# Patient Record
Sex: Male | Born: 1940 | Race: White | Hispanic: No | Smoking: Current every day smoker
Health system: Southern US, Community
[De-identification: ages and names within clinical notes are randomized; demographics above are authoritative.]

## PROBLEM LIST (undated history)

## (undated) DIAGNOSIS — I10 Essential (primary) hypertension: Secondary | ICD-10-CM

## (undated) DIAGNOSIS — E119 Type 2 diabetes mellitus without complications: Secondary | ICD-10-CM

## (undated) DIAGNOSIS — E785 Hyperlipidemia, unspecified: Secondary | ICD-10-CM

## (undated) DIAGNOSIS — I219 Acute myocardial infarction, unspecified: Secondary | ICD-10-CM

## (undated) DIAGNOSIS — C801 Malignant (primary) neoplasm, unspecified: Secondary | ICD-10-CM

## (undated) HISTORY — DX: Type 2 diabetes mellitus without complications: E11.9

## (undated) HISTORY — DX: Essential (primary) hypertension: I10

## (undated) HISTORY — DX: Acute myocardial infarction, unspecified: I21.9

## (undated) HISTORY — DX: Malignant (primary) neoplasm, unspecified: C80.1

## (undated) HISTORY — DX: Hyperlipidemia, unspecified: E78.5

---

## 2013-06-01 ENCOUNTER — Encounter: Payer: Self-pay | Admitting: Podiatry

## 2013-06-01 ENCOUNTER — Ambulatory Visit (INDEPENDENT_AMBULATORY_CARE_PROVIDER_SITE_OTHER): Payer: Medicare Other | Admitting: Podiatry

## 2013-06-01 VITALS — BP 144/81 | HR 96 | Resp 40 | Ht 66.0 in | Wt 180.0 lb

## 2013-06-01 DIAGNOSIS — M79609 Pain in unspecified limb: Secondary | ICD-10-CM

## 2013-06-01 DIAGNOSIS — B351 Tinea unguium: Secondary | ICD-10-CM

## 2013-06-01 NOTE — Progress Notes (Signed)
  Subjective:    Patient ID: Jose Knight, male    DOB: [birth year], 72 y.o.   MRN: [id number] "I don't think he's had his toenails trimmed in about 2-3 years," stated patient's daughter.  HPI Comments: N  Long, Diabetes, thick, painful L  Debridement B/L  D  2-3 years O  Gradually  C  Gotten Worse A  Shoes T  None      Review of Systems  HENT: Positive for hearing loss.   Respiratory: Positive for shortness of breath.   Musculoskeletal: Positive for gait problem.  All other systems reviewed and are negative.   Patient's medical history is not totally known however he does seem to include diabetes, hypertension. Patient does not know what medications he is totally taking at this time. He has recently moved from [place] to [place] to live with his daughter.     Objective:   Physical Exam Pleasant 72 year old white male presents with his daughter.  Vascular: The DP and PT pulses are two over four bilaterally. Capillary fill is immediate bilaterally.  Neurological: Sensation detained in monofilament wire intact Jennifer 10 locations bilaterally. Vibratory sensation intact bilaterally.  Dermatological: Extreme neglect of hygiene with interdigital buildup of hyperkeratotic tissue and lack of hygiene. All the toenails are exceptionally , elongated , extending beyond the distal toes as much as 2 inches and curling into the adjacent toes or into the toe itself. The lack of hygiene extensor was lower legs.  Musculoskeletal: No deformities noted bilaterally. No restriction ankle subtalar midtarsal joints bilaterally.      Assessment & Plan:   Assessment: Extremely neglected hygiene lower extremities and feet bilaterally. Significant painful onychomycoses x10. Satisfactory neurovascular status bilaterally  Plan: All 10 toenails and debrided without any bleeding. Patient and daughter advised to begin daily soaking in warm soapy water, and rubbing the  excessive skin buildup and debris with a washcloth on a daily basis until the skin looks normal.  Reappoint at three-month intervals.

## 2013-06-01 NOTE — Patient Instructions (Signed)
Wash and soak  feet daily and in more soapy water. Take a washcloth and gently massage excessive skin buildup daily until skin looks normal. Rub into his lower legs and feet and all-purpose skin lotion. Reappoint at three-month intervals.

## 2013-08-26 ENCOUNTER — Emergency Department (HOSPITAL_COMMUNITY)
Admission: EM | Admit: 2013-08-26 | Discharge: 2013-08-26 | Disposition: A | Discharge status: Home / Self Care | Payer: Medicare Other | Attending: Emergency Medicine | Admitting: Emergency Medicine

## 2013-08-26 ENCOUNTER — Emergency Department (HOSPITAL_COMMUNITY): Payer: Medicare Other

## 2013-08-26 ENCOUNTER — Encounter (HOSPITAL_COMMUNITY): Payer: Self-pay | Admitting: Emergency Medicine

## 2013-08-26 DIAGNOSIS — F17200 Nicotine dependence, unspecified, uncomplicated: Secondary | ICD-10-CM | POA: Insufficient documentation

## 2013-08-26 DIAGNOSIS — R221 Localized swelling, mass and lump, neck: Secondary | ICD-10-CM

## 2013-08-26 DIAGNOSIS — R220 Localized swelling, mass and lump, head: Secondary | ICD-10-CM | POA: Insufficient documentation

## 2013-08-26 DIAGNOSIS — Z79899 Other long term (current) drug therapy: Secondary | ICD-10-CM | POA: Insufficient documentation

## 2013-08-26 DIAGNOSIS — Z792 Long term (current) use of antibiotics: Secondary | ICD-10-CM | POA: Insufficient documentation

## 2013-08-26 DIAGNOSIS — Z859 Personal history of malignant neoplasm, unspecified: Secondary | ICD-10-CM | POA: Insufficient documentation

## 2013-08-26 DIAGNOSIS — E119 Type 2 diabetes mellitus without complications: Secondary | ICD-10-CM | POA: Insufficient documentation

## 2013-08-26 DIAGNOSIS — IMO0002 Reserved for concepts with insufficient information to code with codable children: Secondary | ICD-10-CM | POA: Insufficient documentation

## 2013-08-26 DIAGNOSIS — E785 Hyperlipidemia, unspecified: Secondary | ICD-10-CM | POA: Insufficient documentation

## 2013-08-26 DIAGNOSIS — I10 Essential (primary) hypertension: Secondary | ICD-10-CM | POA: Insufficient documentation

## 2013-08-26 DIAGNOSIS — I252 Old myocardial infarction: Secondary | ICD-10-CM | POA: Insufficient documentation

## 2013-08-26 LAB — CBC WITH DIFFERENTIAL/PLATELET
BASOS ABS: 0.1 10*3/uL (ref 0.0–0.1)
Basophils Relative: 1 % (ref 0–1)
Eosinophils Absolute: 0.2 10*3/uL (ref 0.0–0.7)
Eosinophils Relative: 2 % (ref 0–5)
HEMATOCRIT: 46.5 % (ref 39.0–52.0)
Hemoglobin: 15.6 g/dL (ref 13.0–17.0)
LYMPHS PCT: 25 % (ref 12–46)
Lymphs Abs: 2.5 10*3/uL (ref 0.7–4.0)
MCH: 33.7 pg (ref 26.0–34.0)
MCHC: 33.5 g/dL (ref 30.0–36.0)
MCV: 100.4 fL — ABNORMAL HIGH (ref 78.0–100.0)
Monocytes Absolute: 0.8 10*3/uL (ref 0.1–1.0)
Monocytes Relative: 8 % (ref 3–12)
NEUTROS ABS: 6.6 10*3/uL (ref 1.7–7.7)
Neutrophils Relative %: 64 % (ref 43–77)
PLATELETS: 138 10*3/uL — AB (ref 150–400)
RBC: 4.63 MIL/uL (ref 4.22–5.81)
RDW: 12.5 % (ref 11.5–15.5)
WBC: 10.3 10*3/uL (ref 4.0–10.5)

## 2013-08-26 LAB — BASIC METABOLIC PANEL
BUN: 25 mg/dL — AB (ref 6–23)
CHLORIDE: 101 meq/L (ref 96–112)
CO2: 25 mEq/L (ref 19–32)
Calcium: 9.3 mg/dL (ref 8.4–10.5)
Creatinine, Ser: 1.54 mg/dL — ABNORMAL HIGH (ref 0.50–1.35)
GFR calc Af Amer: 51 mL/min — ABNORMAL LOW (ref >90)
GFR, EST NON AFRICAN AMERICAN: 44 mL/min — AB (ref >90)
GLUCOSE: 111 mg/dL — AB (ref 70–99)
Potassium: 5.6 mEq/L — ABNORMAL HIGH (ref 3.7–5.3)
Sodium: 138 mEq/L (ref 137–147)

## 2013-08-26 LAB — PRO B NATRIURETIC PEPTIDE: Pro B Natriuretic peptide (BNP): 80.4 pg/mL (ref 0–125)

## 2013-08-26 MED ORDER — METHYLPREDNISOLONE SODIUM SUCC 125 MG IJ SOLR
125.0000 mg | Freq: Once | INTRAMUSCULAR | Status: AC
  Administered 2013-08-26: 125 mg via INTRAVENOUS
  Filled 2013-08-26: qty 2

## 2013-08-26 MED ORDER — PREDNISONE 10 MG PO TABS: 40.0000 mg | ORAL_TABLET | Freq: Every day | ORAL | Status: DC

## 2013-08-26 MED ORDER — FAMOTIDINE 20 MG PO TABS: 20.0000 mg | ORAL_TABLET | Freq: Two times a day (BID) | ORAL | Status: DC

## 2013-08-26 MED ORDER — DEXTROSE 5 % IV SOLN
1.0000 g | Freq: Once | INTRAVENOUS | Status: AC
  Administered 2013-08-26: 1 g via INTRAVENOUS
  Filled 2013-08-26: qty 10

## 2013-08-26 MED ORDER — FAMOTIDINE IN NACL 20-0.9 MG/50ML-% IV SOLN
20.0000 mg | Freq: Once | INTRAVENOUS | Status: AC
  Administered 2013-08-26: 20 mg via INTRAVENOUS
  Filled 2013-08-26: qty 50

## 2013-08-26 MED ORDER — SODIUM CHLORIDE 0.9 % IV BOLUS (SEPSIS)
500.0000 mL | Freq: Once | INTRAVENOUS | Status: AC
  Administered 2013-08-26: 500 mL via INTRAVENOUS

## 2013-08-26 MED ORDER — SODIUM CHLORIDE 0.9 % IV SOLN
INTRAVENOUS | Status: DC
  Administered 2013-08-26: 19:00:00 via INTRAVENOUS

## 2013-08-26 MED ORDER — DIPHENHYDRAMINE HCL 25 MG PO TABS: 25.0000 mg | ORAL_TABLET | Freq: Four times a day (QID) | ORAL | Status: DC

## 2013-08-26 MED ORDER — CLINDAMYCIN HCL 150 MG PO CAPS: 150.0000 mg | ORAL_CAPSULE | Freq: Four times a day (QID) | ORAL | Status: DC

## 2013-08-26 MED ORDER — DIPHENHYDRAMINE HCL 50 MG/ML IJ SOLN
25.0000 mg | Freq: Once | INTRAMUSCULAR | Status: AC
  Administered 2013-08-26: 21:00:00 via INTRAVENOUS
  Filled 2013-08-26: qty 1

## 2013-08-26 MED ORDER — IOHEXOL 300 MG/ML  SOLN
100.0000 mL | Freq: Once | INTRAMUSCULAR | Status: AC | PRN
  Administered 2013-08-26: 100 mL via INTRAVENOUS

## 2013-08-26 NOTE — ED Notes (Signed)
Patient transported to CT 

## 2013-08-26 NOTE — ED Provider Notes (Signed)
CSN: [id number]     Arrival date & time 08/26/13  1514 History   First MD Initiated Contact with Patient 08/26/13 1702     Chief Complaint  Patient presents with  . Foreign Body     (Consider location/radiation/quality/duration/timing/severity/associated sxs/prior Treatment) The history is provided by the patient and a relative.   73 year old male brought in by his daughter. Patient felt as if a meatloaf got stuck while he was eating at several hours ago. He has a sensation is still stuck in his throat. Patient in triage reported shortness of breath but that once back in the room but denied any shortness of breath. Sats were 95%. Patient however has had the something to drink and a banana teeth since then it went down fine. Patient feels that the foreign body is just above his Adam's apple. Patient handling her saliva fine. No history of this ever happening before. Patient does have an extensive smoking history. Patient does have a history of diabetes.  Past Medical History  Diagnosis Date  . Cancer   . Myocardial infarction   . Diabetes mellitus without complication   . Hypertension   . Hyperlipidemia    History reviewed. No pertinent past surgical history. Family History  Problem Relation Age of Onset  . Kidney disease Mother   . COPD Father    History  Substance Use Topics  . Smoking status: Current Every Day Smoker -- 1.00 packs/day    Types: Cigarettes  . Smokeless tobacco: Not on file  . Alcohol Use: No    Review of Systems  Constitutional: Negative for fever.  HENT: Positive for trouble swallowing. Negative for congestion.   Eyes: Negative for redness.  Respiratory: Negative for shortness of breath.   Gastrointestinal: Negative for abdominal pain.  Musculoskeletal: Negative for myalgias.  Skin: Negative for rash.  Neurological: Negative for speech difficulty and headaches.  Hematological: Does not bruise/bleed easily.  Psychiatric/Behavioral: Negative for  confusion.      Allergies  Review of patient's allergies indicates no known allergies.  Home Medications   Current Outpatient Rx  Name  Route  Sig  Dispense  Refill  . lisinopril (PRINIVIL,ZESTRIL) 20 MG tablet   Oral   Take 20 mg by mouth daily.         . metFORMIN (GLUMETZA) 500 MG (MOD) 24 hr tablet   Oral   Take 500 mg by mouth daily with breakfast.         . clindamycin (CLEOCIN) 150 MG capsule   Oral   Take 1 capsule (150 mg total) by mouth every 6 (six) hours.   28 capsule   0   . diphenhydrAMINE (BENADRYL) 25 MG tablet   Oral   Take 1 tablet (25 mg total) by mouth every 6 (six) hours.   20 tablet   0   . famotidine (PEPCID) 20 MG tablet   Oral   Take 1 tablet (20 mg total) by mouth 2 (two) times daily.   30 tablet   0   . predniSONE (DELTASONE) 10 MG tablet   Oral   Take 4 tablets (40 mg total) by mouth daily.   20 tablet   0    BP 173/77  Pulse 107  Temp(Src) 98 F (36.7 C) (Oral)  Resp 18  SpO2 97% Physical Exam  Nursing note and vitals reviewed. Constitutional: He is oriented to person, place, and time. He appears well-developed and well-nourished. No distress.  HENT:  Head: Normocephalic and atraumatic.  Mouth/Throat:  Oropharynx is clear and moist. No oropharyngeal exudate.  Oropharynx without evidence of foreign body uvula is midline no exudate no swelling of tongue or lips no pharyngeal swelling noted.  Eyes: Conjunctivae and EOM are normal. Pupils are equal, round, and reactive to light.  Neck: Normal range of motion. Neck supple. No tracheal deviation present. No thyromegaly present.  Subtle swelling of the area below the chin noted no erythema. No cervical adenopathy.  Cardiovascular: Normal rate, regular rhythm and normal heart sounds.   No murmur heard. Pulmonary/Chest: Effort normal and breath sounds normal. No stridor. No respiratory distress.  Abdominal: Soft. Bowel sounds are normal. There is no tenderness.  Musculoskeletal:  Normal range of motion.  Lymphadenopathy:    He has no cervical adenopathy.  Neurological: He is alert and oriented to person, place, and time. No cranial nerve deficit. He exhibits normal muscle tone. Coordination normal.    ED Course  Procedures (including critical care time) Labs Review Labs Reviewed  CBC WITH DIFFERENTIAL - Abnormal; Notable for the following:    MCV 100.4 (*)    Platelets 138 (*)    All other components within normal limits  BASIC METABOLIC PANEL - Abnormal; Notable for the following:    Potassium 5.6 (*)    Glucose, Bld 111 (*)    BUN 25 (*)    Creatinine, Ser 1.54 (*)    GFR calc non Af Amer 44 (*)    GFR calc Af Amer 51 (*)    All other components within normal limits  PRO B NATRIURETIC PEPTIDE   Results for orders placed during the hospital encounter of 08/26/13  CBC WITH DIFFERENTIAL      Result Value Ref Range   WBC 10.3  4.0 - 10.5 K/uL   RBC 4.63  4.22 - 5.81 MIL/uL   Hemoglobin 15.6  13.0 - 17.0 g/dL   HCT 46.5  39.0 - 52.0 %   MCV 100.4 (*) 78.0 - 100.0 fL   MCH 33.7  26.0 - 34.0 pg   MCHC 33.5  30.0 - 36.0 g/dL   RDW 12.5  11.5 - 15.5 %   Platelets 138 (*) 150 - 400 K/uL   Neutrophils Relative % 64  43 - 77 %   Neutro Abs 6.6  1.7 - 7.7 K/uL   Lymphocytes Relative 25  12 - 46 %   Lymphs Abs 2.5  0.7 - 4.0 K/uL   Monocytes Relative 8  3 - 12 %   Monocytes Absolute 0.8  0.1 - 1.0 K/uL   Eosinophils Relative 2  0 - 5 %   Eosinophils Absolute 0.2  0.0 - 0.7 K/uL   Basophils Relative 1  0 - 1 %   Basophils Absolute 0.1  0.0 - 0.1 K/uL  BASIC METABOLIC PANEL      Result Value Ref Range   Sodium 138  137 - 147 mEq/L   Potassium 5.6 (*) 3.7 - 5.3 mEq/L   Chloride 101  96 - 112 mEq/L   CO2 25  19 - 32 mEq/L   Glucose, Bld 111 (*) 70 - 99 mg/dL   BUN 25 (*) 6 - 23 mg/dL   Creatinine, Ser 1.54 (*) 0.50 - 1.35 mg/dL   Calcium 9.3  8.4 - 10.5 mg/dL   GFR calc non Af Amer 44 (*) >90 mL/min   GFR calc Af Amer 51 (*) >90 mL/min  PRO B  NATRIURETIC PEPTIDE      Result Value Ref Range  Pro B Natriuretic peptide (BNP) 80.4  0 - 125 pg/mL    Imaging Review Dg Chest 2 View  08/26/2013   CLINICAL DATA:  Shortness of breath and cough. Possible food impaction.  EXAM: CHEST  2 VIEW  COMPARISON:  None.  FINDINGS: The heart size and mediastinal contours are within normal limits. Both lungs are clear. The visualized skeletal structures are unremarkable.  IMPRESSION: No active cardiopulmonary disease.   Electronically Signed   By: Kalman Jewels M.D.   On: 08/26/2013 18:10   Ct Soft Tissue Neck Wo Contrast  08/26/2013   CLINICAL DATA:  Cough, sore throat, clinical concern of foreign body.  EXAM: CT NECK WITHOUT CONTRAST  TECHNIQUE: Multidetector CT imaging of the neck was performed following the standard protocol without intravenous contrast.  COMPARISON:  None.  FINDINGS: The skull base is unremarkable.  Evaluation of the neck demonstrates ill-defined increased soft tissue fullness in the left lingual tonsil region. This has a vague nodular appearance. Image 63 series 5. No definitive well-defined mass is appreciated though evaluation is limited secondary due to the lack of IV contrast. The airway is patent. The left parapharyngeal space is poorly visualized. There is asymmetric narrowing of the prevertebral space on the left.  The laryngeal region is unremarkable.  Subcentimeter lymph nodes identified within the carotid space as well as within the anterior-posterior cervical chain regions. No free fluid or loculated fluid collections are appreciated. The vascular structures demonstrate atherosclerotic calcifications within the carotid vessels. There is no evidence of radiopaque foreign bodies.  The salivary glands are symmetric and appear unremarkable. The parotid glands are symmetric and appear unremarkable. Small nodules are identified within the parotid glands likely representing lymph nodes.  Evaluation of the lung apices demonstrates  prominence of the interstitial markings. A vague ground-glass nodular density projects within the medial apex of the left upper lobe image 22 series 1055. This area measures 8.9 mm in diameter. Mild centrilobular emphysematous changes are appreciated within lung apices. No focal regions of consolidation are identified.  There is no evidence of aggressive appearing osseous lesions. Degenerative disc disease changes appreciated within the lower cervical spine at the C6-7 level.  IMPRESSION: 1. Findings concerning for soft tissue prominence along the base of the tongue and the lingual tonsil region on the left. These findings are concerning for a mass. Infection cannot be excluded if clinically appropriate. Further evaluation with contrasted neck CT is recommended. The airway is patent. Small subcentimeter lymph nodes are appreciated within the anterior posterior cervical chains. 2. Indeterminate ground-glass nodule within the left upper lobe. Further evaluation with nonemergent chest CT is recommended. 3. 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   Electronically Signed   By: Margaree Mackintosh M.D.   On: 08/26/2013 18:23   Ct Soft Tissue Neck W Contrast  08/26/2013   CLINICAL DATA:  Foreign body sensation in throat after eating several areas ago. Shortness of breath.  EXAM: CT NECK WITH CONTRAST  TECHNIQUE: Multidetector CT imaging of the neck was performed using the standard protocol following the bolus administration of intravenous contrast.  CONTRAST:  142mL OMNIPAQUE IOHEXOL 300 MG/ML  SOLN  COMPARISON:  CT NECK W/O CM dated 08/26/2013  FINDINGS: The soft tissues of the oropharynx now appear more symmetric. No focal mucosal lesion is identified. However, the epiglottis is swollen and there is a small amount of fluid within the retropharyngeal space. No drainable fluid collections are identified. There is no evidence of foreign body within the airway.  The vocal cords do not appear swollen. The glottis is  closed.  The visualized intracranial contents are unremarkable. The paranasal sinuses are clear. There are no enlarged cervical lymph nodes. The salivary and thyroid glands appear unremarkable. There is stable spondylosis with disc space loss and uncinate spurring at C6-7. No epidural inflammatory changes are identified.  There is some subcutaneous edema in suprasternal region without focal fluid collection. Mild synovial thickening at both sternoclavicular joints appears symmetric.  There are diffuse vascular calcifications with luminal stenosis in both carotid bulbs.  Emphysematous changes are again noted both lung apices. There is a stable ground-glass density in the left upper lobe measuring 9 mm on image 110.  IMPRESSION: 1. Follow examination confirms the presence of inflammatory changes in the oropharynx and supraglottic space with thickening of the epiglottis and a small amount of retropharyngeal fluid. Consider allergic reaction. 2. No focal mucosal lesion or foreign body identified. ENT follow-up recommended. 3. Mild suprasternal subcutaneous edema may reflect cellulitis. 4. Bilateral carotid disease with luminal stenosis in the bulbous. 5. These results were called by telephone at the time of interpretation on 08/26/2013 at 8:27 PM to Dr. Fredia Sorrow , who verbally acknowledged these results.   Electronically Signed   By: Camie Patience M.D.   On: 08/26/2013 20:27    EKG Interpretation   None       MDM   Final diagnoses:  Throat swelling    Patient presented with foreign body sensation and thought that he had meatloaf stuck in his throat. However patient was handling saliva well and actually had done something to the injury since the event with the meatloaf. Workup pursued first the chest x-ray negative and CT soft tissue neck without contrast raise concerns for a mass affect. There was repeated with contrast which raised concerns perhaps 4 inflammatory changes oropharynx and supraglottic  space suggestive of maybe an allergic reaction. Mass is still not completely ruled out in a patient with significant smoking history. There never was any clinical indication or CT indication of foreign body.  The patient was nontoxic no acute distress able to even drink in the emergency department without any problems. Room air sats were 95%. Patient's treatment plan focused on treatment for possible infection with clindamycin and then treatment for allergic reaction with Benadryl Pepcid and prednisone. Patient will also continue that at home. Referral and followup information for your nose and throat for a good thorough examination of the oral pharynx area provided. Patient understands the importance of following up with ear nose and throat Dr.  Patient discharged home.    Mervin Kung, MD 09/01/13 [phone]

## 2013-08-26 NOTE — ED Notes (Signed)
Patient transported from CT 

## 2013-08-26 NOTE — ED Notes (Signed)
Per pt and family pt was eating meatloaf a few hours ago and got and now it is stuck in his throat. sts SOB. sats 95% at triage

## 2013-08-26 NOTE — Discharge Instructions (Signed)
Follow up with ear nose and throat: Make an appointment. In the meantime we are not exactly sure why he had the swelling in your throat could be allergic reaction. So take the Benadryl every 6 hours for the next 2 days take Pepcid for the next 7 days. Take prednisone for the next 5 days. Take the clindamycin the antibiotic until finished. Return for any newer worse symptoms. Return for any difficulty swallowing or breathing. Return for any swelling of the tongue. The ear nose and throat specialist will be able to look up your CAT scans that were done today.

## 2013-09-07 ENCOUNTER — Encounter: Payer: Self-pay | Admitting: Podiatry

## 2013-09-07 ENCOUNTER — Ambulatory Visit (INDEPENDENT_AMBULATORY_CARE_PROVIDER_SITE_OTHER): Payer: Medicare Other | Admitting: Podiatry

## 2013-09-07 VITALS — BP 137/72 | HR 87 | Resp 18

## 2013-09-07 DIAGNOSIS — B351 Tinea unguium: Secondary | ICD-10-CM

## 2013-09-07 DIAGNOSIS — M79609 Pain in unspecified limb: Secondary | ICD-10-CM

## 2013-09-08 NOTE — Progress Notes (Signed)
Patient ID: Jose Knight, male   DOB: 1941/06/21, 73 y.o.   MRN: [id number]  Subjective: This patient presents for debridement of painful mycotic toenails. On the initial visit of 06/01/2013 and extremely neglected hygiene with excessive dermal buildup was noted bilaterally.  Objective: Residual dermal buildup from lack of hygiene still persists on toes 1 through 5 bilaterally and residual buildup noted on the plantar aspect. All toenails are elongated, brittle, discolored, hypertrophied.  Assessment: Symptomatic onychomycoses x10 Improving personal hygiene  Plan: Nails x10 debrided back without a bleeding. Instructed patient and daughter to continue gently massaging the dermal buildup off with a washcloth on a daily basis.  Reappoint x3 months

## 2013-12-14 ENCOUNTER — Ambulatory Visit: Payer: Medicare Other | Admitting: Podiatry

## 2014-02-08 ENCOUNTER — Encounter: Payer: Self-pay | Admitting: Podiatry

## 2014-02-08 ENCOUNTER — Ambulatory Visit (INDEPENDENT_AMBULATORY_CARE_PROVIDER_SITE_OTHER): Payer: Medicare Other | Admitting: Podiatry

## 2014-02-08 VITALS — BP 154/81 | HR 91 | Resp 18

## 2014-02-08 DIAGNOSIS — M79609 Pain in unspecified limb: Secondary | ICD-10-CM

## 2014-02-08 DIAGNOSIS — M79673 Pain in unspecified foot: Secondary | ICD-10-CM

## 2014-02-08 DIAGNOSIS — B351 Tinea unguium: Secondary | ICD-10-CM

## 2014-02-09 NOTE — Progress Notes (Signed)
Patient ID: Jose Knight, male   DOB: 10-Aug-1940, 73 y.o.   MRN: [id number]  Subjective: This patient presents today self complaining of painful toenails and keratoses. He has a history of extremely neglected poor hygiene and admits that he does not want to start her to soak his feet on a regular basis.  Objective: Residual dermal debris from lack of hygiene noted on the dorsum and plantar aspect of the right and left feet. The toenails are elongated, brittle, discolored and hypertrophied 6-10 Nucleated plantar keratoses left  Assessment: Onychomycosis with symptoms 6-10 Poor hygiene Plantar keratoses  Plan: Nails x10 and keratoses x1 debrided without any bleeding  Advised patient to have daily soaks and washing with a washcloth  Reappoint at 3 month intervals

## 2014-02-19 ENCOUNTER — Telehealth: Payer: Self-pay | Admitting: Cardiovascular Disease

## 2014-02-19 NOTE — Telephone Encounter (Signed)
Closed Encounter  °

## 2014-04-07 ENCOUNTER — Ambulatory Visit: Payer: Medicare Other | Admitting: Cardiovascular Disease

## 2014-04-07 ENCOUNTER — Ambulatory Visit (INDEPENDENT_AMBULATORY_CARE_PROVIDER_SITE_OTHER): Payer: Medicare Other | Admitting: Cardiovascular Disease

## 2014-04-07 ENCOUNTER — Encounter: Payer: Self-pay | Admitting: Cardiovascular Disease

## 2014-04-07 VITALS — BP 118/68 | HR 100 | Resp 16 | Ht 66.0 in | Wt 169.9 lb

## 2014-04-07 DIAGNOSIS — I10 Essential (primary) hypertension: Secondary | ICD-10-CM | POA: Insufficient documentation

## 2014-04-07 DIAGNOSIS — R0609 Other forms of dyspnea: Secondary | ICD-10-CM | POA: Insufficient documentation

## 2014-04-07 DIAGNOSIS — R0602 Shortness of breath: Secondary | ICD-10-CM

## 2014-04-07 DIAGNOSIS — R9431 Abnormal electrocardiogram [ECG] [EKG]: Secondary | ICD-10-CM

## 2014-04-07 DIAGNOSIS — F17200 Nicotine dependence, unspecified, uncomplicated: Secondary | ICD-10-CM | POA: Insufficient documentation

## 2014-04-07 DIAGNOSIS — R0789 Other chest pain: Secondary | ICD-10-CM

## 2014-04-07 DIAGNOSIS — R079 Chest pain, unspecified: Secondary | ICD-10-CM

## 2014-04-07 DIAGNOSIS — E119 Type 2 diabetes mellitus without complications: Secondary | ICD-10-CM | POA: Insufficient documentation

## 2014-04-07 NOTE — Patient Instructions (Signed)
Your physician has requested that you have an echocardiogram. Echocardiography is a painless test that uses sound waves to create images of your heart. It provides your doctor with information about the size and shape of your heart and how well your heart's chambers and valves are working. This procedure takes approximately one hour. There are no restrictions for this procedure.  Your physician has requested that you have a lexiscan myoview. For further information please visit [facility]. Please follow instruction sheet, as given.  Your physician discussed the hazards of tobacco use. Tobacco use cessation is recommended and techniques and options to help you quit were discussed.  You can purchase the nicotine patches at any pharmacy - start with the 14mg  strength.  Dr. Sallyanne Kuster recommends that you schedule a follow-up appointment in: 6 weeks.

## 2014-04-07 NOTE — Assessment & Plan Note (Signed)
He appears to be generally interested in smoking cessation. He is a little wary of taking Chantix because of the potential for sleep disturbance. He does not have a history of depression or seizures. He does want to try using the nicotine patch. I recommended starting with a 14 mg dose and weaning off gradually. Discussed other behavioral and lifestyle changes that could be beneficial to further the goal of permanent smoking cessation.

## 2014-04-07 NOTE — Assessment & Plan Note (Signed)
Excellent control on ACE inhibitor

## 2014-04-07 NOTE — Assessment & Plan Note (Signed)
Jose Knight has clear physical findings and history consistent with emphysema that could explain his exertional dyspnea. Cannot exclude a component of congestive heart failure as well.

## 2014-04-07 NOTE — Assessment & Plan Note (Signed)
He is on a tiny amount of metformin. Will retrieve his lipid profile from [facility].

## 2014-04-07 NOTE — Assessment & Plan Note (Signed)
He has a long-standing history of numerous coronary risk factors and ongoing smoking. He has an abnormal electrocardiogram and a reported history of previous myocardial infarction. I think he should undergo some type of functional study. Unfortunately his emphysema will prevent him from exercising on the treadmill for sufficient duration of time. We'll schedule him for [employer] He is not wheezing today.

## 2014-04-07 NOTE — Progress Notes (Signed)
Patient ID: Jose Knight, male   DOB: 02/17/1941, 73 y.o.   MRN: [id number]     Reason for office visit Shortness of breath, chest discomfort, abnormal ECG  Mr. Laswell is a 73 year old gentleman who recently moved to [place] from [place] to live with his daughter. He is a lifelong smoker (> 60-pack-year history), recently cut back to 10 cigarettes a day. He wants to quit smoking area and previous attempts have all been unsuccessful. The longest he has stayed quit is 2 weeks.  16 or 17 years ago he had a "light heart attack". He was in the hospital for about 6 days. Not long after that he had a "stroke". Neither one of these left him with any noticeable sequelae. At one point subsequent to his heart attack he had a stress test that was "okay".  His type 2 diabetes mellitus is well controlled on metformin monotherapy and he has well treated systemic hypertension. He reports having recent cholesterol levels are were "good". He is now seeing Barbee Shropshire, NP.  He describes episodes of shortness of breath with wheezing that improved with bronchodilators. During the spells he often develops a pressure-like sensation in his chest. He also has exertional dyspnea, although he denies exertional chest discomfort. He does not have palpitations, syncope, lower extremity edema, orthopnea or PND. He has no neurological complaints.  He has been a widower since 2008. Until recently he lived with one of his daughters in [place]. He has now moved in with his other daughter.   Allergies  Allergen Reactions  . Iodine Rash    Current Outpatient Prescriptions  Medication Sig Dispense Refill  . albuterol (ACCUNEB) 0.63 MG/3ML nebulizer solution Take 1 ampule by nebulization every 6 (six) hours as needed for wheezing.      Marland Kitchen aspirin 81 MG chewable tablet Chew 81 mg by mouth daily.      Marland Kitchen lisinopril (PRINIVIL,ZESTRIL) 20 MG tablet Take 20 mg by mouth daily.      . metFORMIN (GLUMETZA) 500 MG (MOD) 24 hr  tablet Take 500 mg by mouth daily with breakfast.      . tiotropium (SPIRIVA) 18 MCG inhalation capsule Place 18 mcg into inhaler and inhale daily.       No current facility-administered medications for this visit.    Past Medical History  Diagnosis Date  . Cancer   . Myocardial infarction   . Diabetes mellitus without complication   . Hypertension   . Hyperlipidemia     No past surgical history on file.  Family History  Problem Relation Age of Onset  . Kidney disease Mother   . COPD Father     History   Social History  . Marital Status: Widowed    Spouse Name: N/A    Number of Children: N/A  . Years of Education: N/A   Occupational History  . Not on file.   Social History Main Topics  . Smoking status: Current Every Day Smoker -- 1.00 packs/day for 58 years    Types: Cigarettes  . Smokeless tobacco: Not on file  . Alcohol Use: No  . Drug Use: No  . Sexual Activity: Not on file   Other Topics Concern  . Not on file   Social History Narrative  . No narrative on file    Review of systems: The patient specifically denies  orthopnea, paroxysmal nocturnal dyspnea, syncope, palpitations, focal neurological deficits, intermittent claudication, lower extremity edema, unexplained weight gain, cough, hemoptysis or wheezing.  The  patient also denies abdominal pain, nausea, vomiting, dysphagia, diarrhea, constipation, polyuria, polydipsia, dysuria, hematuria, frequency, urgency, abnormal bleeding or bruising, fever, chills, unexpected weight changes, mood swings, change in skin or hair texture, change in voice quality, auditory or visual problems, allergic reactions or rashes, new musculoskeletal complaints other than usual "aches and pains".   PHYSICAL EXAM BP 118/68  Pulse 100  Resp 16  Ht 5\' 6"  (1.676 m)  Wt 77.066 kg (169 lb 14.4 oz)  BMI 27.44 kg/m2  General: Alert, oriented x3, no distress Head: no evidence of trauma, PERRL, EOMI, no exophtalmos or lid lag,  no myxedema, no xanthelasma; normal ears, nose and oropharynx Neck: normal jugular venous pulsations and no hepatojugular reflux; brisk carotid pulses without delay and no carotid bruits Chest: Diminished breath sounds throughout but clear to auscultation, no signs of consolidation by percussion or palpation, normal fremitus, symmetrical and full respiratory excursions Cardiovascular: normal position and quality of the apical impulse, regular rhythm, normal first and second heart sounds, no rubs or gallops, -- 2/6 systolic ejection murmur that is early peaking, and the aortic focus Abdomen: Prominent ventral obesity, no tenderness or distention, no masses by palpation, no abnormal pulsatility or arterial bruits, normal bowel sounds, no hepatosplenomegaly Extremities: no clubbing, cyanosis or edema; 2+ radial, ulnar and brachial pulses bilaterally; 2+ right femoral, posterior tibial and dorsalis pedis pulses; 2+ left femoral, posterior tibial and dorsalis pedis pulses; no subclavian or femoral bruits Neurological: grossly nonfocal   EKG: Sinus rhythm, poor R wave progression consistent with old anterior wall myocardial infarction in leads V1 through V4, 1 mm horizontal ST depression in leads 1 and aVL, possible left atrial enlargement. QTC 443 ms.  Lipid Panel  No results found for this basename: chol, trig, hdl, cholhdl, vldl, ldlcalc, ldldirect    BMET    Component Value Date/Time   NA 138 08/26/2013 1723   K 5.6* 08/26/2013 1723   CL 101 08/26/2013 1723   CO2 25 08/26/2013 1723   GLUCOSE 111* 08/26/2013 1723   BUN 25* 08/26/2013 1723   CREATININE 1.54* 08/26/2013 1723   CALCIUM 9.3 08/26/2013 1723   GFRNONAA 44* 08/26/2013 1723   GFRAA 51* 08/26/2013 1723     ASSESSMENT AND PLAN Dyspnea on exertion Mr. Capaldi has clear physical findings and history consistent with emphysema that could explain his exertional dyspnea. Cannot exclude a component of congestive heart failure as well.  Chest pain  of uncertain etiology He has a long-standing history of numerous coronary risk factors and ongoing smoking. He has an abnormal electrocardiogram and a reported history of previous myocardial infarction. I think he should undergo some type of functional study. Unfortunately his emphysema will prevent him from exercising on the treadmill for sufficient duration of time. We'll schedule him for [employer] He is not wheezing today.  DM type 2 (diabetes mellitus, type 2) He is on a tiny amount of metformin. Will retrieve his lipid profile from [facility].  HTN (hypertension) Excellent control on ACE inhibitor  Tobacco dependence He appears to be generally interested in smoking cessation. He is a little wary of taking Chantix because of the potential for sleep disturbance. He does not have a history of depression or seizures. He does want to try using the nicotine patch. I recommended starting with a 14 mg dose and weaning off gradually. Discussed other behavioral and lifestyle changes that could be beneficial to further the goal of permanent smoking cessation.   Orders Placed This Encounter  Procedures  .  Myocardial Perfusion Imaging  . EKG 12-Lead  . 2D Echocardiogram without contrast   Meds ordered this encounter  Medications  . aspirin 81 MG chewable tablet    Sig: Chew 81 mg by mouth daily.  Marland Kitchen tiotropium (SPIRIVA) 18 MCG inhalation capsule    Sig: Place 18 mcg into inhaler and inhale daily.  Marland Kitchen albuterol (ACCUNEB) 0.63 MG/3ML nebulizer solution    Sig: Take 1 ampule by nebulization every 6 (six) hours as needed for wheezing.    Holli Humbles, MD, [place] [phone] office [phone] pager

## 2014-04-08 ENCOUNTER — Telehealth (HOSPITAL_COMMUNITY): Payer: Self-pay | Admitting: *Deleted

## 2014-04-23 ENCOUNTER — Telehealth (HOSPITAL_COMMUNITY): Payer: Self-pay

## 2014-04-23 NOTE — Telephone Encounter (Signed)
Encounter complete. 

## 2014-04-27 ENCOUNTER — Telehealth (HOSPITAL_COMMUNITY): Payer: Self-pay

## 2014-04-27 NOTE — Telephone Encounter (Signed)
Encounter complete. 

## 2014-04-28 ENCOUNTER — Ambulatory Visit (HOSPITAL_BASED_OUTPATIENT_CLINIC_OR_DEPARTMENT_OTHER)
Admission: RE | Admit: 2014-04-28 | Discharge: 2014-04-28 | Disposition: A | Discharge status: Home / Self Care | Payer: Medicare Other | Source: Ambulatory Visit | Attending: Cardiovascular Disease | Admitting: Cardiovascular Disease

## 2014-04-28 ENCOUNTER — Ambulatory Visit (HOSPITAL_COMMUNITY)
Admission: RE | Admit: 2014-04-28 | Discharge: 2014-04-28 | Disposition: A | Discharge status: Home / Self Care | Payer: Medicare Other | Source: Ambulatory Visit | Attending: Cardiology | Admitting: Cardiology

## 2014-04-28 DIAGNOSIS — E119 Type 2 diabetes mellitus without complications: Secondary | ICD-10-CM | POA: Insufficient documentation

## 2014-04-28 DIAGNOSIS — R0602 Shortness of breath: Secondary | ICD-10-CM

## 2014-04-28 DIAGNOSIS — R9431 Abnormal electrocardiogram [ECG] [EKG]: Secondary | ICD-10-CM | POA: Diagnosis not present

## 2014-04-28 DIAGNOSIS — I350 Nonrheumatic aortic (valve) stenosis: Secondary | ICD-10-CM | POA: Diagnosis not present

## 2014-04-28 DIAGNOSIS — R079 Chest pain, unspecified: Secondary | ICD-10-CM | POA: Diagnosis not present

## 2014-04-28 DIAGNOSIS — Z720 Tobacco use: Secondary | ICD-10-CM | POA: Insufficient documentation

## 2014-04-28 DIAGNOSIS — I10 Essential (primary) hypertension: Secondary | ICD-10-CM | POA: Diagnosis not present

## 2014-04-28 DIAGNOSIS — I059 Rheumatic mitral valve disease, unspecified: Secondary | ICD-10-CM

## 2014-04-28 MED ORDER — REGADENOSON 0.4 MG/5ML IV SOLN
0.4000 mg | Freq: Once | INTRAVENOUS | Status: AC
  Administered 2014-04-28: 0.4 mg via INTRAVENOUS

## 2014-04-28 MED ORDER — AMINOPHYLLINE 25 MG/ML IV SOLN
75.0000 mg | Freq: Once | INTRAVENOUS | Status: AC
  Administered 2014-04-28: 75 mg via INTRAVENOUS

## 2014-04-28 MED ORDER — TECHNETIUM TC 99M SESTAMIBI GENERIC - CARDIOLITE
10.8000 | Freq: Once | INTRAVENOUS | Status: AC | PRN
  Administered 2014-04-28: 11 via INTRAVENOUS

## 2014-04-28 MED ORDER — TECHNETIUM TC 99M SESTAMIBI GENERIC - CARDIOLITE
30.4000 | Freq: Once | INTRAVENOUS | Status: AC | PRN
  Administered 2014-04-28: 30 via INTRAVENOUS

## 2014-04-28 NOTE — Progress Notes (Signed)
2D Echo Performed 04/28/2014    Jose Knight, RCS  

## 2014-04-28 NOTE — Procedures (Addendum)
[place] [address] [place] [address] [phone]  Cardiology Nuclear Med Study  Jose Knight is a 73 y.o. male     MRN : [id number]     DOB: 22-Nov-1940  Procedure Date: 04/28/2014  Nuclear Med Background Indication for Stress Test:  Evaluation for Ischemia and Abnormal EKG History:  Emphysema and CAD;MI;No prior NUC MPI for comparison. Cardiac Risk Factors: CVA, Family History - CAD, Hypertension, Lipids, NIDDM and Smoker  Symptoms:  Chest Pain, DOE and SOB   Nuclear Pre-Procedure Caffeine/Decaff Intake:  1:00am NPO After: 11am   IV Site: R Forearm  IV 0.9% NS with Angio Cath:  22g  Chest Size (in):  42"  IV Started by: Rolene Course, RN  Height: 5\' 6"  (1.676 m)  Cup Size: n/a  BMI:  Body mass index is 27.29 kg/(m^2). Weight:  169 lb (76.658 kg)   Tech Comments:  n/a    Nuclear Med Study 1 or 2 day study: 1 day  Stress Test Type:  [place] Provider:  Sanda Klein, MD   Resting Radionuclide: Technetium 67m Sestamibi  Resting Radionuclide Dose: 10.8 mCi   Stress Radionuclide:  Technetium 36m Sestamibi  Stress Radionuclide Dose: 30.4 mCi           Stress Protocol Rest HR: 77 Stress HR: 101  Rest BP: 128/66 Stress BP: 133/72  Exercise Time (min): n/a METS: n/a   Predicted Max HR: 149 bpm % Max HR: 75.17 bpm Rate Pressure Product: 16240  Dose of Adenosine (mg):  n/a Dose of Lexiscan: 0.4 mg  Dose of Atropine (mg): n/a Dose of Dobutamine: n/a mcg/kg/min (at max HR)  Stress Test Technologist: Leane Para, CCT Nuclear Technologist: Imagene Riches, CNMT   Rest Procedure:  Myocardial perfusion imaging was performed at rest 45 minutes following the intravenous administration of Technetium 24m Sestamibi. Stress Procedure:  The patient received IV Lexiscan 0.4 mg over 15-seconds.  Technetium 53m Sestamibi injected at 30-seconds.  Patient experienced SOB and 75 mg Aminophylline IV was  administered.  There were no significant changes with Lexiscan.  Quantitative spect images were obtained after a 45 minute delay.  Transient Ischemic Dilatation (Normal <1.22):  1.22  QGS EDV:  99 ml QGS ESV:  46 ml LV Ejection Fraction: 31%     Rest ECG: NSR, old anterior MI, widespread T wave inversion in inferior and anterolateral leads  Stress ECG: No significant change from baseline ECG  QPS Raw Data Images:  Normal; no motion artifact; normal heart/lung ratio. Stress Images:  there is a large in size, moderate to severe reduction in tracer uptake in the mid and apical anterior wall and distal inferior wall.  Rest Images:  Comparison with the stress images reveals no significant change. There is minimal peri-infarct reversibility Subtraction (SDS):  There is a fixed defect that is most consistent with a previous infarction. LV Wall Motion:  extensive hypokinesis of the mid-distal anterior wall, apex and distal inferior wall. Moderate to severe reduction in overall systolic function. EF 31%  Impression Exercise Capacity:  Lexiscan with no exercise. BP Response:  Normal blood pressure response. Clinical Symptoms:  No significant symptoms noted. ECG Impression:  No significant ECG changes with Lexiscan. 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

## 2014-05-10 ENCOUNTER — Ambulatory Visit: Payer: Medicare Other | Admitting: Podiatry

## 2014-06-14 ENCOUNTER — Telehealth: Payer: Self-pay | Admitting: Cardiovascular Disease

## 2014-06-16 NOTE — Telephone Encounter (Signed)
Closed encounter °

## 2014-09-14 DEATH — deceased

## 2015-08-17 IMAGING — CT CT NECK W/O CM
4 of 5 series · 16 of 33 positions shown, 18 images · non-contrast
Comparison: None.

CLINICAL DATA: Cough, sore throat, clinical concern of foreign
body.

EXAM:
CT NECK WITHOUT CONTRAST
TECHNIQUE: Multidetector CT imaging of the neck was performed following the
standard protocol without intravenous contrast.

[Series 5: soft tissue · axial · 0.47mm/px · z∈[+20,+208]mm · 5 of 142 slices shown]
[im 24/142  soft-tissue]
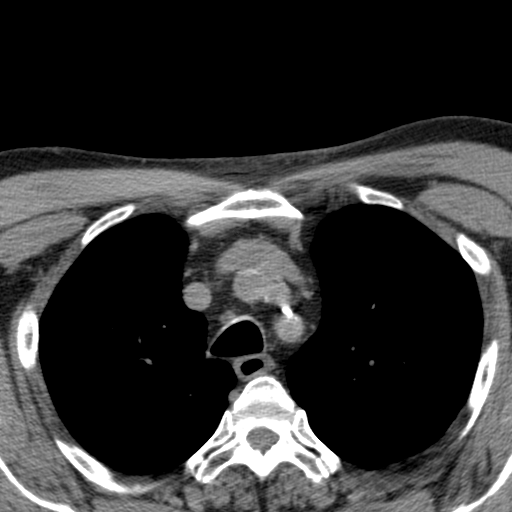
[im 48/142  soft-tissue]
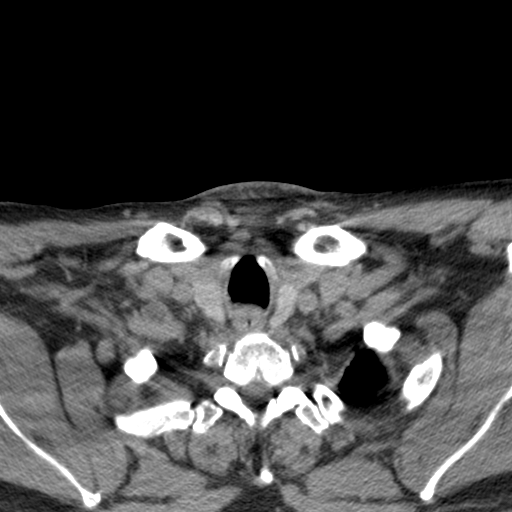
[im 71/142  soft-tissue]
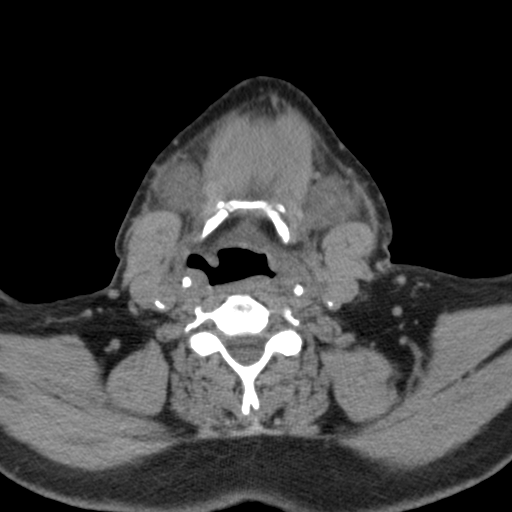
[im 95/142  soft-tissue]
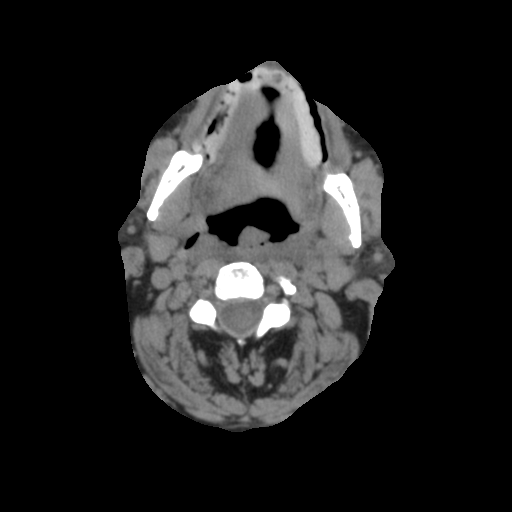
[im 118/142  soft-tissue]
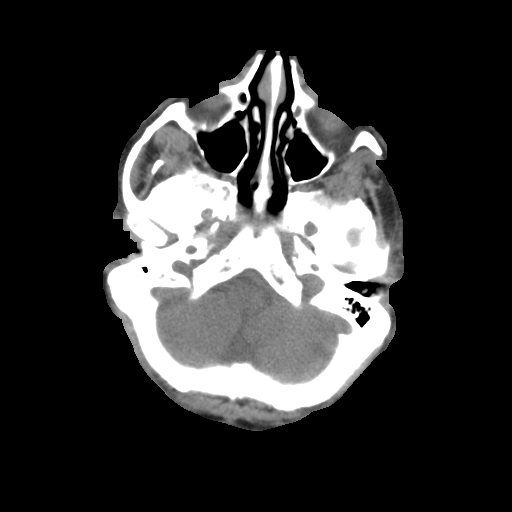

[orthogonals · axial · 0.47mm/px · z∈[+4,+107]mm · 3 of 114 slices shown, 4 images]
[im 29/114  soft-tissue]
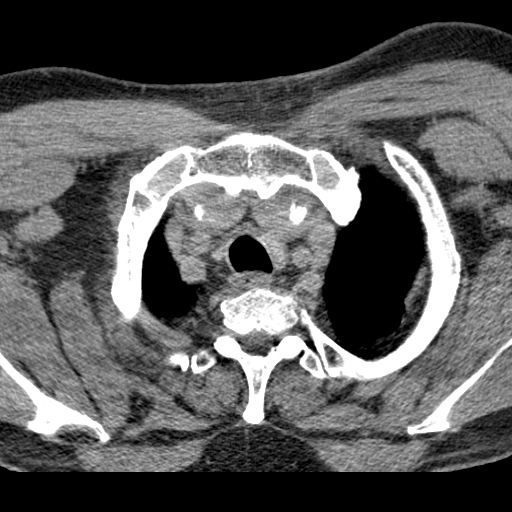
[im 29/114  bone]
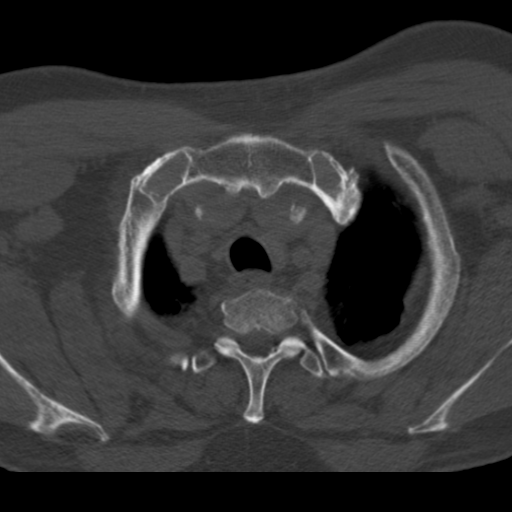
[im 57/114  bone]
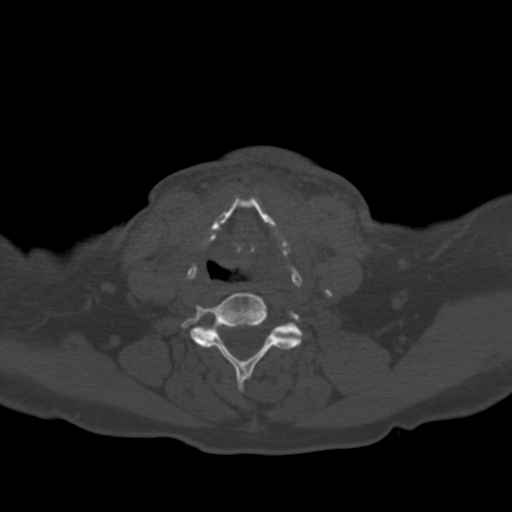
[im 85/114  bone]
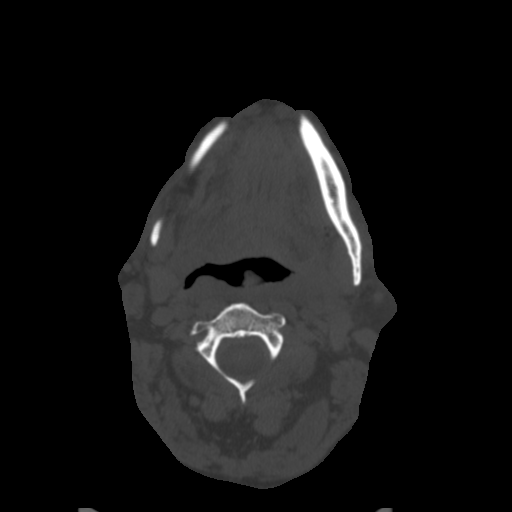

[sag · sagittal · 0.55mm/px · 5 of 82 slices shown, 6 images]
[im 28/82  bone]
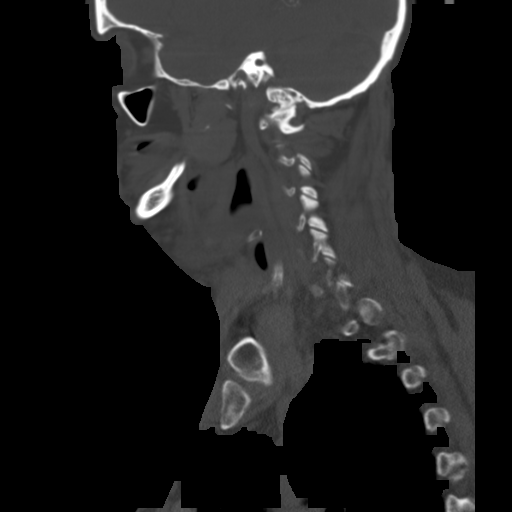
[im 34/82  bone]
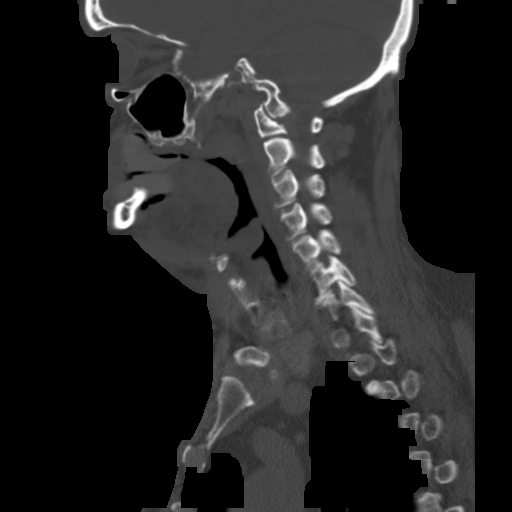
[im 41/82  soft-tissue]
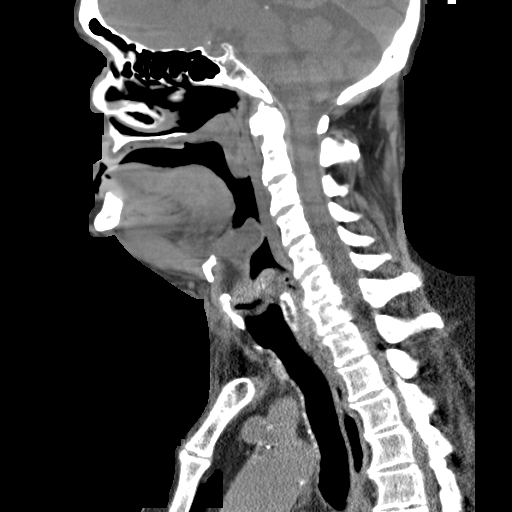
[im 41/82  bone]
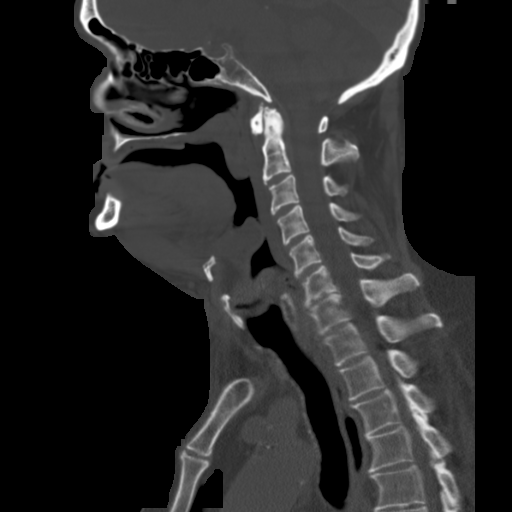
[im 48/82  bone]
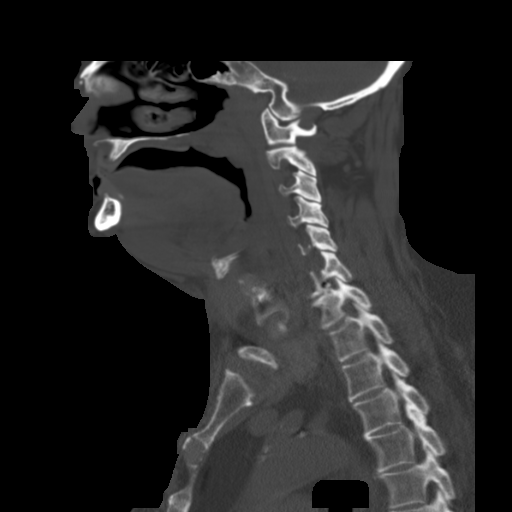
[im 55/82  bone]
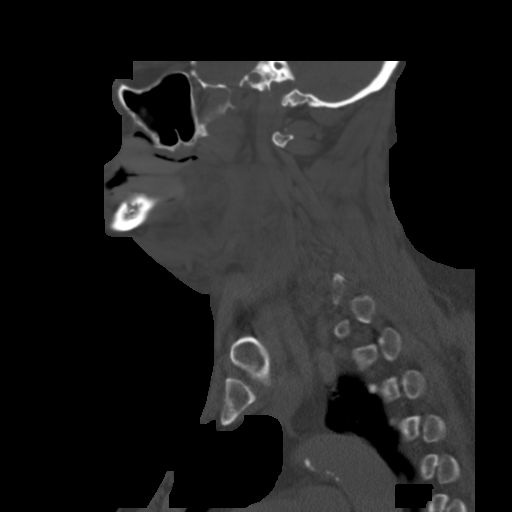

[cor · coronal · 0.55mm/px · 3 of 106 slices shown]
[im 22/106  bone]
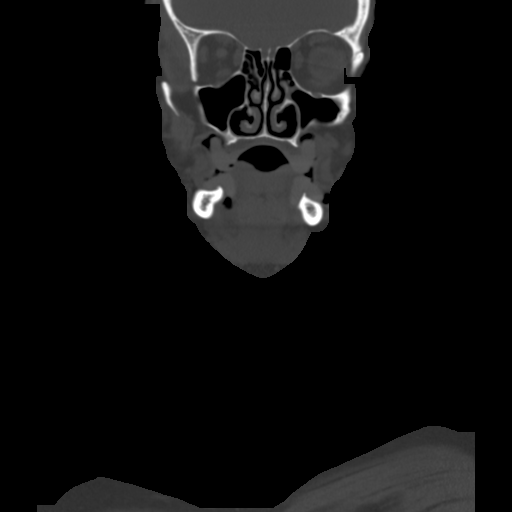
[im 43/106  bone]
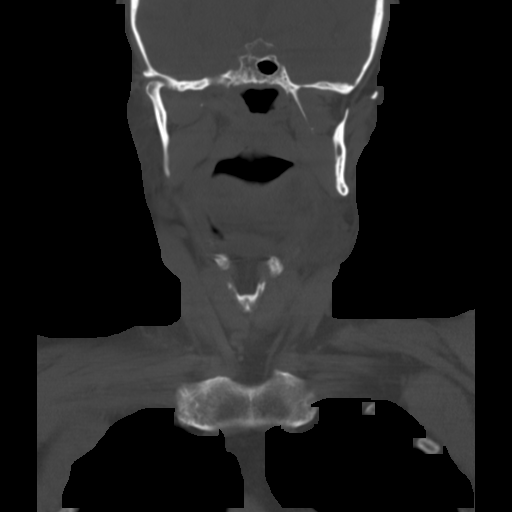
[im 64/106  bone]
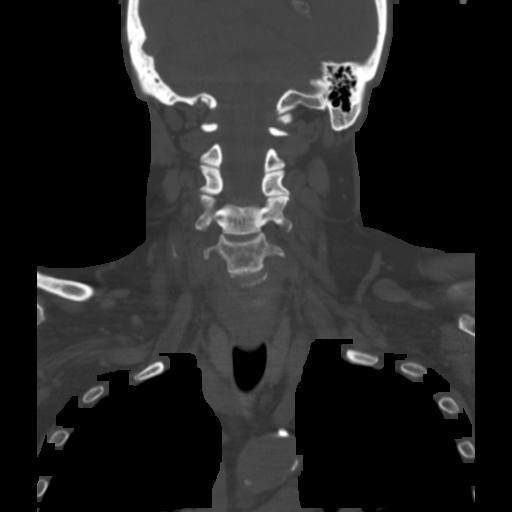

[16 of 33 positions shown; findings below may reference images not displayed]

FINDINGS: The skull base is unremarkable.

Evaluation of the neck demonstrates ill-defined increased soft
tissue fullness in the left lingual tonsil region. This has a vague
nodular appearance. Image 63 series 5. No definitive well-defined
mass is appreciated though evaluation is limited secondary due to
the lack of IV contrast. The airway is patent. The left
parapharyngeal space is poorly visualized. There is asymmetric
narrowing of the prevertebral space on the left.

The laryngeal region is unremarkable.

Subcentimeter lymph nodes identified within the carotid space as
well as within the anterior-posterior cervical chain regions. No
free fluid or loculated fluid collections are appreciated. The
vascular structures demonstrate atherosclerotic calcifications
within the carotid vessels. There is no evidence of radiopaque
foreign bodies.

The salivary glands are symmetric and appear unremarkable. The
parotid glands are symmetric and appear unremarkable. Small nodules
are identified within the parotid glands likely representing lymph
nodes.

Evaluation of the lung apices demonstrates prominence of the
interstitial markings. A vague ground-glass nodular density projects
within the medial apex of the left upper lobe image 22 series 2511.
This area measures 8.9 mm in diameter. Mild centrilobular
emphysematous changes are appreciated within lung apices. No focal
regions of consolidation are identified.

There is no evidence of aggressive appearing osseous lesions.
Degenerative disc disease changes appreciated within the lower
cervical spine at the C6-7 level.
IMPRESSION: 1. Findings concerning for soft tissue prominence along the base of
the tongue and the lingual tonsil region on the left. These findings
are concerning for a mass. Infection cannot be excluded if
clinically appropriate. Further evaluation with contrasted neck CT
is recommended. The airway is patent. Small subcentimeter lymph
nodes are appreciated within the anterior posterior cervical chains.
2. Indeterminate ground-glass nodule within the left upper lobe.
Further evaluation with nonemergent chest CT is recommended.
3. Degenerative disc disease changes at the C6-7 level.
